# Patient Record
Sex: Male | Born: 1975 | Race: White | Hispanic: No | Marital: Single | Smoking: Current every day smoker
Health system: Southern US, Community
[De-identification: ages and names within clinical notes are randomized; demographics above are authoritative.]

## PROBLEM LIST (undated history)

## (undated) DIAGNOSIS — E7800 Pure hypercholesterolemia, unspecified: Secondary | ICD-10-CM

## (undated) DIAGNOSIS — I10 Essential (primary) hypertension: Secondary | ICD-10-CM

## (undated) HISTORY — DX: Essential (primary) hypertension: I10

## (undated) HISTORY — DX: Pure hypercholesterolemia, unspecified: E78.00

---

## 2002-04-15 ENCOUNTER — Emergency Department (HOSPITAL_COMMUNITY): Admission: EM | Admit: 2002-04-15 | Discharge: 2002-04-15 | Payer: Self-pay | Admitting: Emergency Medicine

## 2017-10-12 ENCOUNTER — Ambulatory Visit: Payer: 59 | Admitting: Physician Assistant

## 2017-10-12 ENCOUNTER — Encounter: Payer: Self-pay | Admitting: Physician Assistant

## 2017-10-12 ENCOUNTER — Other Ambulatory Visit: Payer: Self-pay

## 2017-10-12 VITALS — BP 144/78 | HR 71 | Temp 98.2°F | Ht 66.0 in | Wt 158.4 lb

## 2017-10-12 DIAGNOSIS — L03113 Cellulitis of right upper limb: Secondary | ICD-10-CM | POA: Diagnosis not present

## 2017-10-12 MED ORDER — CHLORHEXIDINE GLUCONATE 2 % EX SOLN: 1.0000 "application " | Freq: Every day | CUTANEOUS | 1 refills | Status: AC

## 2017-10-12 MED ORDER — SULFAMETHOXAZOLE-TRIMETHOPRIM 800-160 MG PO TABS: 1.0000 | ORAL_TABLET | Freq: Two times a day (BID) | ORAL | 0 refills | Status: AC

## 2017-10-12 NOTE — Progress Notes (Signed)
PRIMARY CARE AT [facility] [address], [place] [address] [phone]  Date:  10/12/2017   Name:  Dylan Lam   DOB:  July 31, 1975   MRN:  [id number]  PCP:  Patient, No Pcp Per    History of Present Illness:  Dylan Lam is a 42 y.o. male patient who presents to PCP with  Chief Complaint  Patient presents with  . right arm pain    there is a bumb on pt right arm in the inner elbow area. There are at least 2 more bump on body. Open soar, pus and  red ring and. Pt has a before, during and after on body.      Rash of swollen nodules that get bumped and open up to pus lesions.  They are mildly painful.  Occurs at the chest and extremities.  No fever.  He went to an urgent care where he was given doxycycline for about 10 days.  Completed treatment.  This improved, but then came back when he completed antibiotic course more than 1 week ago.   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  He works out at home.    There are no active problems to display for this patient.   No past medical history on file.   Social History   Tobacco Use  . Smoking status: Current Every Day Smoker    Types: Cigarettes  . Smokeless tobacco: Never Used  . Tobacco comment: every other day   Substance Use Topics  . Alcohol use: No    Frequency: Never  . Drug use: No    No family history on file.  Allergies  Allergen Reactions  . Penicillins Rash    Was told this was an allergy as baby    Medication list has been reviewed and updated.  Current Outpatient Medications on File Prior to Visit  Medication Sig Dispense Refill  . acetaminophen (TYLENOL) 325 MG tablet Take 650 mg by mouth every 6 (six) hours as needed.    . loratadine (CLARITIN) 10 MG tablet Take 10 mg by mouth daily as needed for allergies.     No current facility-administered medications on file prior to visit.     ROS ROS otherwise unremarkable unless listed above.  Physical Examination: BP (!) 144/78 (BP Location:  Left Arm, Patient Position: Sitting, Cuff Size: Normal)   Pulse 71   Temp 98.2 F (36.8 C) (Oral)   Ht 5\' 6"  (1.676 m)   Wt 158 lb 6.4 oz (71.8 kg)   SpO2 99%   BMI 25.57 kg/m  Ideal Body Weight: Weight in (lb) to have BMI = 25: 154.6  Physical Exam  Constitutional: He is oriented to person, place, and time. He appears well-developed and well-nourished. No distress.  HENT:  Head: Normocephalic and atraumatic.  Eyes: Conjunctivae and EOM are normal. Pupils are equal, round, and reactive to light.  Cardiovascular: Normal rate.  Pulmonary/Chest: Effort normal. No respiratory distress.  Neurological: He is alert and oriented to person, place, and time.  Skin: Skin is warm and dry. He is not diaphoretic.  Right anterior medial elbow 1cm with erythematous nodule with central purulent discharge. Right side of chest with similar smaller lesion about .5 cm Abdomen with erythematous nodule  Psychiatric: He has a normal mood and affect. His behavior is normal.     Assessment and Plan: [name] is a 42 y.o. male who is here today for cc of  Chief Complaint  Patient presents  with  . right arm pain    there is a bumb on pt right arm in the inner elbow area. There are at least 2 more bump on body. Open soar, pus and  red ring and. Pt has a before, during and after on body.   started bactrim wound culture obtained.  Given chlorhexidine daily for 5 days.  Discussed wound care.  Follow up in 1 week.  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  Cellulitis of right upper extremity - Plan: sulfamethoxazole-trimethoprim (BACTRIM DS,SEPTRA DS) 800-160 MG tablet, Chlorhexidine Gluconate 2 % SOLN, WOUND CULTURE, CANCELED: WOUND CULTURE  Trena PlattStephanie English, PA-C [facility] 3/19/20194:20 PM

## 2017-10-12 NOTE — Patient Instructions (Addendum)
Please wash with the chlorhexidine.  Use a cloth and change it daily with each wash.  Wash the area wound as well, at your shoulder. I will follow up with you regarding the lab results. Please use a new cloth each time you bathe Please disinfect the materials you come into contact with.   Keep this area bandaged until it scabs over.  You will wash it twice per day with soap and water.  Avoid deodorant soaps.  You may use antibacterial soap or gold dial.      Cellulitis, Adult Cellulitis is a skin infection. The infected area is usually red and sore. This condition occurs most often in the arms and lower legs. It is very important to get treated for this condition. Follow these instructions at home:  Take over-the-counter and prescription medicines only as told by your doctor.  If you were prescribed an antibiotic medicine, take it as told by your doctor. Do not stop taking the antibiotic even if you start to feel better.  Drink enough fluid to keep your pee (urine) clear or pale yellow.  Do not touch or rub the infected area.  Raise (elevate) the infected area above the level of your heart while you are sitting or lying down.  Place warm or cold wet cloths (warm or cold compresses) on the infected area. Do this as told by your doctor.  Keep all follow-up visits as told by your doctor. This is important. These visits let your doctor make sure your infection is not getting worse. Contact a doctor if:  You have a fever.  Your symptoms do not get better after 1-2 days of treatment.  Your bone or joint under the infected area starts to hurt after the skin has healed.  Your infection comes back. This can happen in the same area or another area.  You have a swollen bump in the infected area.  You have new symptoms.  You feel ill and also have muscle aches and pains. Get help right away if:  Your symptoms get worse.  You feel very sleepy.  You throw up (vomit) or have watery poop  (diarrhea) for a long time.  There are red streaks coming from the infected area.  Your red area gets larger.  Your red area turns darker. This information is not intended to replace advice given to you by your health care provider. Make sure you discuss any questions you have with your health care provider. Document Released: 12/31/2007 Document Revised: 12/20/2015 Document Reviewed: 05/23/2015 Elsevier Interactive Patient Education  2018 [employer]   IF you received an x-ray today, you will receive an invoice from [facility]. Please contact [facility] at [phone] with questions or concerns regarding your invoice.   IF you received labwork today, you will receive an invoice from [facility]. Please contact [facility] at [phone] with questions or concerns regarding your invoice.   Our billing staff will not be able to assist you with questions regarding bills from these companies.  You will be contacted with the lab results as soon as they are available. The fastest way to get your results is to activate your My Chart account. Instructions are located on the last page of this paperwork. If you have not heard from us regarding the results in 2 weeks, please contact this office.

## 2017-10-14 LAB — WOUND CULTURE

## 2017-10-19 ENCOUNTER — Other Ambulatory Visit: Payer: Self-pay

## 2017-10-19 ENCOUNTER — Encounter: Payer: Self-pay | Admitting: Physician Assistant

## 2017-10-19 ENCOUNTER — Ambulatory Visit (INDEPENDENT_AMBULATORY_CARE_PROVIDER_SITE_OTHER): Payer: 59 | Admitting: Physician Assistant

## 2017-10-19 VITALS — BP 130/84 | HR 74 | Temp 98.7°F | Resp 18 | Ht 66.0 in | Wt 159.8 lb

## 2017-10-19 DIAGNOSIS — A4901 Methicillin susceptible Staphylococcus aureus infection, unspecified site: Secondary | ICD-10-CM

## 2017-10-19 MED ORDER — MUPIROCIN 2 % EX OINT: 1.0000 "application " | TOPICAL_OINTMENT | Freq: Two times a day (BID) | CUTANEOUS | 1 refills | Status: AC

## 2017-10-19 NOTE — Progress Notes (Signed)
PRIMARY CARE AT [facility] [address], [place] [address] [phone]  Date:  10/19/2017   Name:  Dylan Lam   DOB:  02-23-1976   MRN:  [id number]  PCP:  Patient, No Pcp Per    History of Present Illness:  KARMELO Lam is a 42 y.o. male patient who presents to PCP with  Chief Complaint  Patient presents with  . 7 day follow up for cellulitis right upper extremity     Patient follows up with rash.  Has scabbed erythematous lesions.  Wound culture was placed and resulted as staph aureus.  This was susceptible to the bactrim.  He reports that the wound is healing.  He is taking the bactrim compliantly.  He notes no fever or other lesion outbreak.    There are no active problems to display for this patient.   History reviewed. No pertinent past medical history.  History reviewed. No pertinent surgical history.  Social History   Tobacco Use  . Smoking status: Current Every Day Smoker    Types: Cigarettes  . Smokeless tobacco: Never Used  . Tobacco comment: every other day   Substance Use Topics  . Alcohol use: No    Frequency: Never  . Drug use: No    History reviewed. No pertinent family history.  Allergies  Allergen Reactions  . Penicillins Rash    Was told this was an allergy as baby    Medication list has been reviewed and updated.  Current Outpatient Medications on File Prior to Visit  Medication Sig Dispense Refill  . acetaminophen (TYLENOL) 325 MG tablet Take 650 mg by mouth every 6 (six) hours as needed.    . loratadine (CLARITIN) 10 MG tablet Take 10 mg by mouth daily as needed for allergies.    Marland Kitchen sulfamethoxazole-trimethoprim (BACTRIM DS,SEPTRA DS) 800-160 MG tablet Take 1 tablet by mouth 2 (two) times daily. 20 tablet 0  . Chlorhexidine Gluconate 2 % SOLN Apply 1 application topically daily for 7 days. (Patient not taking: Reported on 10/19/2017) 60 mL 1   No current facility-administered medications on file prior to visit.     ROS ROS  otherwise unremarkable unless listed above.  Physical Examination: BP (!) 158/89 (BP Location: Right Arm, Patient Position: Sitting, Cuff Size: Normal)   Pulse 74   Temp 98.7 F (37.1 C) (Oral)   Resp 18   Ht 5\' 6"  (1.676 m)   Wt 159 lb 12.8 oz (72.5 kg)   SpO2 98%   BMI 25.79 kg/m  Ideal Body Weight: Weight in (lb) to have BMI = 25: 154.6  Physical Exam  Constitutional: He is oriented to person, place, and time. He appears well-developed and well-nourished. No distress.  HENT:  Head: Normocephalic and atraumatic.  Eyes: Pupils are equal, round, and reactive to light. Conjunctivae and EOM are normal.  Cardiovascular: Normal rate.  Pulmonary/Chest: Effort normal. No respiratory distress.  Neurological: He is alert and oriented to person, place, and time.  Skin: Skin is warm and dry. He is not diaphoretic.  Right skin anterior elbow with healing lesion without purulent fluid.  Mildly erythematous papule at the abdomen and fading lesion at the right upper anterior chest.   Psychiatric: He has a normal mood and affect. His behavior is normal.     Assessment and Plan: Dylan Lam is a 42 y.o. male who is here today for cc of  Chief Complaint  Patient presents with  . 7 day follow up for  cellulitis right upper extremity  given mupirocin for nares.  Advised treatment plan.   Follow up as needed.  Staph aureus infection - Plan: mupirocin ointment (BACTROBAN) 2 %  Trena PlattStephanie Andreu Drudge, PA-C [facility] 3/26/20197:14 PM

## 2017-10-19 NOTE — Patient Instructions (Addendum)
Please continue the antibiotic as prescribed.   Please apply this to the nares as prescribed.  You can do this for 5 days.       IF you received an x-ray today, you will receive an invoice from [facility]. Please contact [facility] at [phone] with questions or concerns regarding your invoice.   IF you received labwork today, you will receive an invoice from [facility]. Please contact [facility] at [phone] with questions or concerns regarding your invoice.   Our billing staff will not be able to assist you with questions regarding bills from these companies.  You will be contacted with the lab results as soon as they are available. The fastest way to get your results is to activate your My Chart account. Instructions are located on the last page of this paperwork. If you have not heard from us regarding the results in 2 weeks, please contact this office.

## 2017-10-20 ENCOUNTER — Encounter: Payer: Self-pay | Admitting: Physician Assistant

## 2017-11-04 ENCOUNTER — Encounter: Payer: Self-pay | Admitting: Physician Assistant

## 2019-11-15 ENCOUNTER — Emergency Department (HOSPITAL_COMMUNITY): Payer: 59

## 2019-11-15 ENCOUNTER — Encounter (HOSPITAL_COMMUNITY): Payer: Self-pay

## 2019-11-15 ENCOUNTER — Other Ambulatory Visit: Payer: Self-pay

## 2019-11-15 ENCOUNTER — Emergency Department (HOSPITAL_COMMUNITY): Admission: EM | Admit: 2019-11-15 | Discharge: 2019-11-15 | Discharge status: Against Medical Advice | Payer: 59

## 2019-11-15 ENCOUNTER — Emergency Department (HOSPITAL_COMMUNITY)
Admission: EM | Admit: 2019-11-15 | Discharge: 2019-11-16 | Disposition: A | Discharge status: Home / Self Care | Payer: 59 | Attending: Emergency Medicine | Admitting: Emergency Medicine

## 2019-11-15 DIAGNOSIS — R55 Syncope and collapse: Secondary | ICD-10-CM | POA: Insufficient documentation

## 2019-11-15 DIAGNOSIS — R42 Dizziness and giddiness: Secondary | ICD-10-CM | POA: Insufficient documentation

## 2019-11-15 DIAGNOSIS — F17210 Nicotine dependence, cigarettes, uncomplicated: Secondary | ICD-10-CM | POA: Insufficient documentation

## 2019-11-15 DIAGNOSIS — I10 Essential (primary) hypertension: Secondary | ICD-10-CM | POA: Insufficient documentation

## 2019-11-15 DIAGNOSIS — R5383 Other fatigue: Secondary | ICD-10-CM | POA: Insufficient documentation

## 2019-11-15 LAB — BASIC METABOLIC PANEL
Anion gap: 12 (ref 5–15)
BUN: 13 mg/dL (ref 6–20)
CO2: 25 mmol/L (ref 22–32)
Calcium: 9 mg/dL (ref 8.9–10.3)
Chloride: 103 mmol/L (ref 98–111)
Creatinine, Ser: 1.03 mg/dL (ref 0.61–1.24)
GFR calc Af Amer: >60 mL/min (ref >60)
GFR calc non Af Amer: >60 mL/min (ref >60)
Glucose, Bld: 101 mg/dL — ABNORMAL HIGH (ref 70–99)
Potassium: 3.7 mmol/L (ref 3.5–5.1)
Sodium: 140 mmol/L (ref 135–145)

## 2019-11-15 LAB — URINALYSIS, ROUTINE W REFLEX MICROSCOPIC
Bilirubin Urine: NEGATIVE
Glucose, UA: NEGATIVE mg/dL
Hgb urine dipstick: NEGATIVE
Ketones, ur: NEGATIVE mg/dL
Leukocytes,Ua: NEGATIVE
Nitrite: NEGATIVE
Protein, ur: NEGATIVE mg/dL
Specific Gravity, Urine: 1.012 (ref 1.005–1.030)
pH: 6 (ref 5.0–8.0)

## 2019-11-15 LAB — GLUCOSE, CAPILLARY: Glucose-Capillary: 102 mg/dL — ABNORMAL HIGH (ref 70–99)

## 2019-11-15 LAB — CBC
HCT: 48.7 % (ref 39.0–52.0)
Hemoglobin: 16.2 g/dL (ref 13.0–17.0)
MCH: 28.8 pg (ref 26.0–34.0)
MCHC: 33.3 g/dL (ref 30.0–36.0)
MCV: 86.7 fL (ref 80.0–100.0)
Platelets: 351 10*3/uL (ref 150–400)
RBC: 5.62 MIL/uL (ref 4.22–5.81)
RDW: 13.9 % (ref 11.5–15.5)
WBC: 10.4 10*3/uL (ref 4.0–10.5)
nRBC: 0 % (ref 0.0–0.2)

## 2019-11-15 LAB — TROPONIN I (HIGH SENSITIVITY): Troponin I (High Sensitivity): 3 ng/L (ref <18)

## 2019-11-15 MED ORDER — SODIUM CHLORIDE 0.9% FLUSH: 3.0000 mL | Freq: Once | INTRAVENOUS | Status: DC

## 2019-11-15 NOTE — ED Provider Notes (Signed)
[facility]-EMERGENCY DEPT Provider Note   CSN: [id number] Arrival date & time: 11/15/19  2109     History Chief Complaint  Patient presents with  . Hypertension  . Dizziness    Dylan Lam is a 44 y.o. male who presents for evaluation of hypertension, dizziness/lightheaded.  Patient reports that about days ago, he was working outside and states that it was hot.  He states that he felt a little funny and described as a lightheaded sensation.  He states that it quickly resolved and he was fine throughout the weekend and was able to continue his normal activities.  He states yesterday while at work, he had a similar episode and just felt more tired and fatigued.  He reports that today, he felt dizzy, lightheaded.  He denies room spinning sensation does not feel wobbly or drunk when he walks.  He states that he feels "like he is afraid that he might pass out and just not himself."  He states that he just feels fatigued and feels like he is out of energy.  He does state that the dizziness/lightheadedness sensation is worse when he bends down.  He was told at work that it might be because of his blood pressure.  He feels like since 2 PM this evening, the symptoms have been persistent.  He does not have any history of hypertension.  He states he has some "Chest congestion" that he attributes to allergies.  He states that he has had this feeling before and denies any heaviness or sharp pain.  He does smoke 12 cigarettes a day.  Denies any other drug use or alcohol use.  He denies any vision changes, difficulty breathing, abdominal pain, nausea/vomiting, numbness/weakness of his arms or legs.  He does not have any history of diabetes or hypertension.  No personal cardiac history.  No family history of MI before the age of 37.  The history is provided by the patient.       History reviewed. No pertinent past medical history.  There are no problems to display for this  patient.   History reviewed. No pertinent surgical history.     No family history on file.  Social History   Tobacco Use  . Smoking status: Current Every Day Smoker    Types: Cigarettes  . Smokeless tobacco: Never Used  . Tobacco comment: every other day   Substance Use Topics  . Alcohol use: No  . Drug use: No    Home Medications Prior to Admission medications   Medication Sig Start Date End Date Taking? Authorizing Provider  amLODipine (NORVASC) 5 MG tablet Take 1 tablet (5 mg total) by mouth daily. 11/16/19   Maxwell Caul, PA-C  mupirocin ointment (BACTROBAN) 2 % Apply 1 application topically 2 (two) times daily. Patient not taking: Reported on 11/15/2019 10/19/17   Trena Platt D, PA  sulfamethoxazole-trimethoprim (BACTRIM DS,SEPTRA DS) 800-160 MG tablet Take 1 tablet by mouth 2 (two) times daily. Patient not taking: Reported on 11/15/2019 10/12/17   Trena Platt D, PA    Allergies    Penicillins  Review of Systems   Review of Systems  Constitutional: Negative for fever.  Eyes: Negative for visual disturbance.  Respiratory: Negative for cough and shortness of breath.   Cardiovascular: Negative for chest pain.  Gastrointestinal: Negative for abdominal pain, nausea and vomiting.  Genitourinary: Negative for dysuria and hematuria.  Neurological: Positive for dizziness and light-headedness. Negative for weakness, numbness and headaches.  All other  systems reviewed and are negative.   Physical Exam Updated Vital Signs BP (!) 151/91   Pulse (!) 56   Temp 98.4 F (36.9 C) (Oral)   Resp 14   Ht 5\' 6"  (1.676 m)   Wt 82.1 kg   SpO2 97%   BMI 29.21 kg/m   Physical Exam Vitals and nursing note reviewed.  Constitutional:      Appearance: Normal appearance. He is well-developed.  HENT:     Head: Normocephalic and atraumatic.  Eyes:     General: Lids are normal.     Conjunctiva/sclera: Conjunctivae normal.     Pupils: Pupils are equal, round, and  reactive to light.     Comments: PERRL. EOMs intact. No nystagmus. No neglect.   Cardiovascular:     Rate and Rhythm: Normal rate and regular rhythm.     Pulses: Normal pulses.          Radial pulses are 2+ on the right side and 2+ on the left side.     Heart sounds: Normal heart sounds. No murmur. No friction rub. No gallop.   Pulmonary:     Effort: Pulmonary effort is normal.     Breath sounds: Normal breath sounds.     Comments: Lungs clear to auscultation bilaterally.  Symmetric chest rise.  No wheezing, rales, rhonchi. Abdominal:     Palpations: Abdomen is soft. Abdomen is not rigid.     Tenderness: There is no abdominal tenderness. There is no guarding.  Musculoskeletal:        General: Normal range of motion.     Cervical back: Full passive range of motion without pain.  Skin:    General: Skin is warm and dry.     Capillary Refill: Capillary refill takes less than 2 seconds.  Neurological:     Mental Status: He is alert and oriented to person, place, and time.     Comments: Cranial nerves III-XII intact Follows commands, Moves all extremities  5/5 strength to BUE and BLE  Sensation intact throughout all major nerve distributions No gait abnormalities  No slurred speech. No facial droop.  Negative Rhomberg.   Psychiatric:        Speech: Speech normal.     ED Results / Procedures / Treatments   Labs (all labs ordered are listed, but only abnormal results are displayed) Labs Reviewed  BASIC METABOLIC PANEL - Abnormal; Notable for the following components:      Result Value   Glucose, Bld 101 (*)    All other components within normal limits  GLUCOSE, CAPILLARY - Abnormal; Notable for the following components:   Glucose-Capillary 102 (*)    All other components within normal limits  CBC  URINALYSIS, ROUTINE W REFLEX MICROSCOPIC  CBG MONITORING, ED  TROPONIN I (HIGH SENSITIVITY)  TROPONIN I (HIGH SENSITIVITY)    EKG EKG Interpretation  Date/Time:  Tuesday November 15 2019 21:25:20 EDT Ventricular Rate:  60 PR Interval:    QRS Duration: 102 QT Interval:  412 QTC Calculation: 412 R Axis:   69 Text Interpretation: Sinus rhythm ST elev, probable normal early repol pattern 12 Lead; Mason-Likar Confirmed by [id number] (Ross Marcus) on 11/16/2019 12:18:13 AM   Radiology DG Chest 2 View  Result Date: 11/15/2019 CLINICAL DATA:  44 year old male with dizziness and chest congestion. EXAM: CHEST - 2 VIEW COMPARISON:  Chest radiograph dated 01/01/2011. FINDINGS: Mild diffuse chronic interstitial coarsening and bronchitic changes. No focal consolidation, pleural effusion, pneumothorax. The cardiac silhouette is within  normal limits. No acute osseous pathology. IMPRESSION: No active cardiopulmonary disease. Electronically Signed   By: Elgie Collard M.D.   On: 11/15/2019 21:41   CT Head Wo Contrast  Result Date: 11/16/2019 CLINICAL DATA:  Dizziness, fall. EXAM: CT HEAD WITHOUT CONTRAST TECHNIQUE: Contiguous axial images were obtained from the base of the skull through the vertex without intravenous contrast. COMPARISON:  None. FINDINGS: Brain: No acute intracranial abnormality. Specifically, no hemorrhage, hydrocephalus, mass lesion, acute infarction, or significant intracranial injury. Vascular: No hyperdense vessel or unexpected calcification. Skull: No acute calvarial abnormality. Sinuses/Orbits: Visualized paranasal sinuses and mastoids clear. Orbital soft tissues unremarkable. Other: None IMPRESSION: Normal study. Electronically Signed   By: Charlett Nose M.D.   On: 11/16/2019 01:09    Procedures Procedures (including critical care time)  Medications Ordered in ED Medications  sodium chloride flush (NS) 0.9 % injection 3 mL (has no administration in time range)  amLODipine (NORVASC) tablet 5 mg (5 mg Oral Given 11/16/19 0206)    ED Course  I have reviewed the triage vital signs and the nursing notes.  Pertinent labs & imaging results that were available  during my care of the patient were reviewed by me and considered in my medical decision making (see chart for details).    MDM Rules/Calculators/A&P                      44 year old male who presents for evaluation of concerns of hypertension, lightheadedness.  No history of hypertension but noted his blood pressure to be slightly elevated at work when he had symptoms.  No fevers, vision changes, numbness/weakness of arms or legs.  On initially arrival, he is afebrile nontoxic-appearing.  Vital signs are stable.  No neuro deficits noted on exam.  Patient able to ambulate without any signs of gait ataxia.  His blood pressure is slightly elevated here.  He states that he has had episodes of high blood pressure before but no history of hypertension.  No true chest pain.  The triage note does mention he is having some chest pain he described to me as more of a chest congestion that he has had previously.  No shortness of breath.  Plan to check labs, EKG.  Additionally, given positional nature of his symptoms, will plan to check CT head for evaluation of intracranial mass.  History/physical exam is not concerning for CVA, posterior circulation stroke, carotid dissection, intracranial hemorrhage.  Additionally, do not suspect ACS etiology given history/physical exam.  Initial trop negative.  CBC unremarkable.  BUN without any acute abnormalities.  UA negative.  Chest x-ray negative for any acute infectious etiology.  CT head negative for any acute intracranial abnormality.  Delta troponin negative.  Patient still slightly elevated blood pressures.  I reviewed his records.  He did have an episode about 2 years ago when he was seen here in the ED where he was hypertensive.  His symptoms could be related to high blood pressure.  We will plan to start patient on a very low-dose of amlodipine for high blood pressure.  Patient instructed to follow-up with his primary care doctor. Discussed patient with Dr. Wilkie Aye  who is agreeable to plan. At this time, patient exhibits no emergent life-threatening condition that require further evaluation in ED. Patient had ample opportunity for questions and discussion. All patient's questions were answered with full understanding. Strict return precautions discussed. Patient expresses understanding and agreement to plan.   Portions of this note were generated with Dragon  dictation software. Dictation errors may occur despite best attempts at proofreading.   Final Clinical Impression(s) / ED Diagnoses Final diagnoses:  Hypertension, unspecified type  Lightheadedness    Rx / DC Orders ED Discharge Orders         Ordered    amLODipine (NORVASC) 5 MG tablet  Daily     11/16/19 0202           Volanda Napoleon, PA-C 11/16/19 0500    Dina Rich Barbette Hair, MD 11/16/19 [phone]

## 2019-11-15 NOTE — ED Notes (Signed)
Patient transported to X-ray 

## 2019-11-15 NOTE — ED Triage Notes (Signed)
Patient arrived stating that he felt dizzy today and checked his blood pressure at work which was elevated. States he is a smoker and "feels something in his chest but there is no pain."

## 2019-11-16 ENCOUNTER — Emergency Department (HOSPITAL_COMMUNITY): Payer: 59

## 2019-11-16 LAB — TROPONIN I (HIGH SENSITIVITY): Troponin I (High Sensitivity): 3 ng/L (ref <18)

## 2019-11-16 MED ORDER — AMLODIPINE BESYLATE 5 MG PO TABS
5.0000 mg | ORAL_TABLET | Freq: Once | ORAL | Status: AC
  Administered 2019-11-16: 5 mg via ORAL
  Filled 2019-11-16: qty 1

## 2019-11-16 MED ORDER — AMLODIPINE BESYLATE 5 MG PO TABS: 5.0000 mg | ORAL_TABLET | Freq: Every day | ORAL | 1 refills | Status: AC

## 2019-11-16 NOTE — Discharge Instructions (Signed)
As we discussed, your work-up here today was reassuring.  Your blood work, CT scan did not show any abnormalities.  Your blood pressure continued to remain elevated here.  This could be contributing to your symptoms.  We will plan to start you on a very low dose of blood pressure medication.  Additionally, it is important that you find a primary care doctor to follow-up with.  I have given you a [facility] referral.  You should follow-up with him to his range primary care.  Stop the amlodipine if you feel lightheaded or like you are going to pass out.  Return to the emergency department for any dizziness, vision changes, numbness/weakness of your arms or legs, vomiting, difficulty walking or any other worsening concerning symptoms.

## 2020-06-15 ENCOUNTER — Encounter: Payer: 59 | Admitting: Registered Nurse

## 2021-07-28 IMAGING — CR DG CHEST 2V
2 series · 2 of 2 positions shown · non-contrast
Comparison: Chest radiograph dated 01/01/2011.

CLINICAL DATA: 43-year-old male with dizziness and chest
congestion.

EXAM:
CHEST - 2 VIEW

[w chest pa]
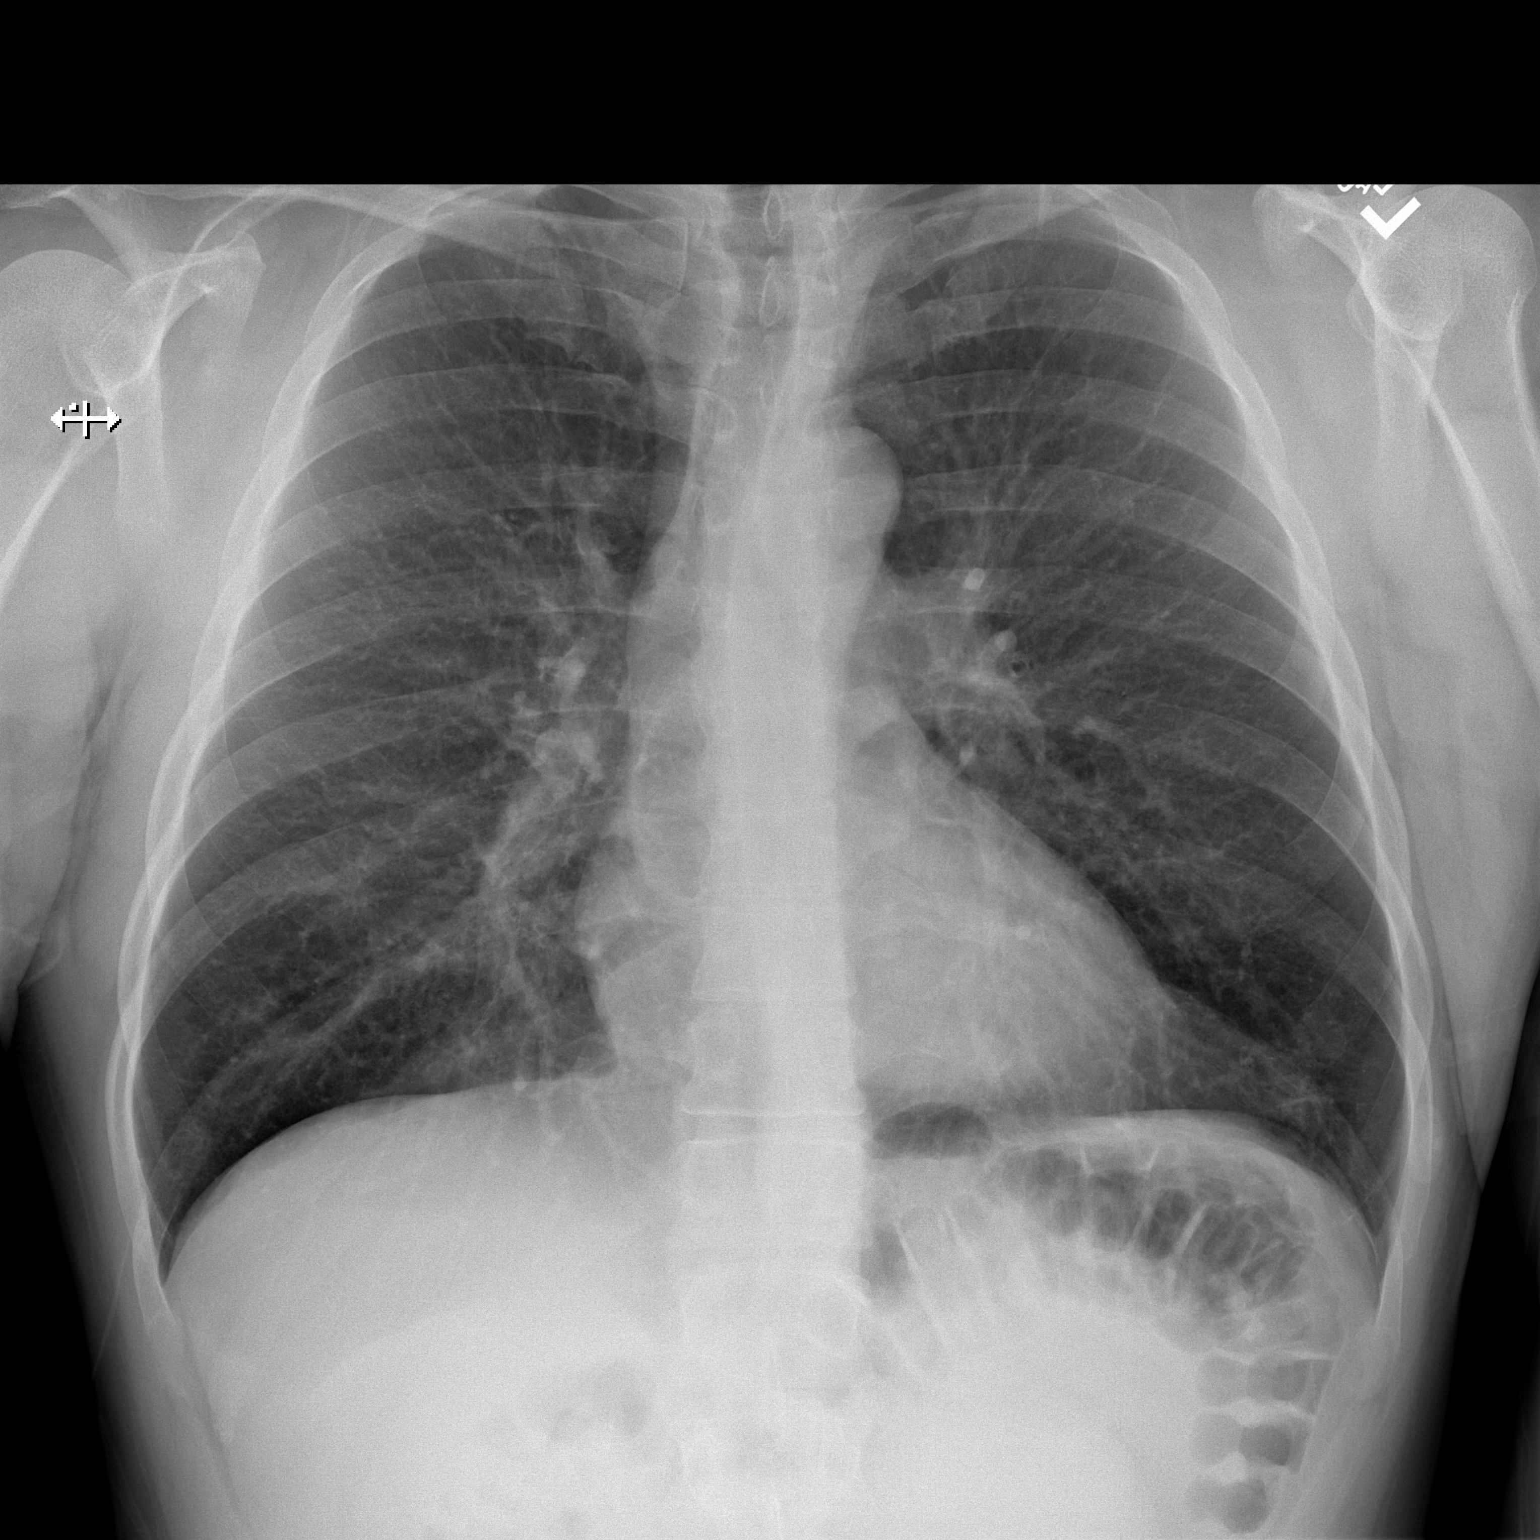

[w chest lat]
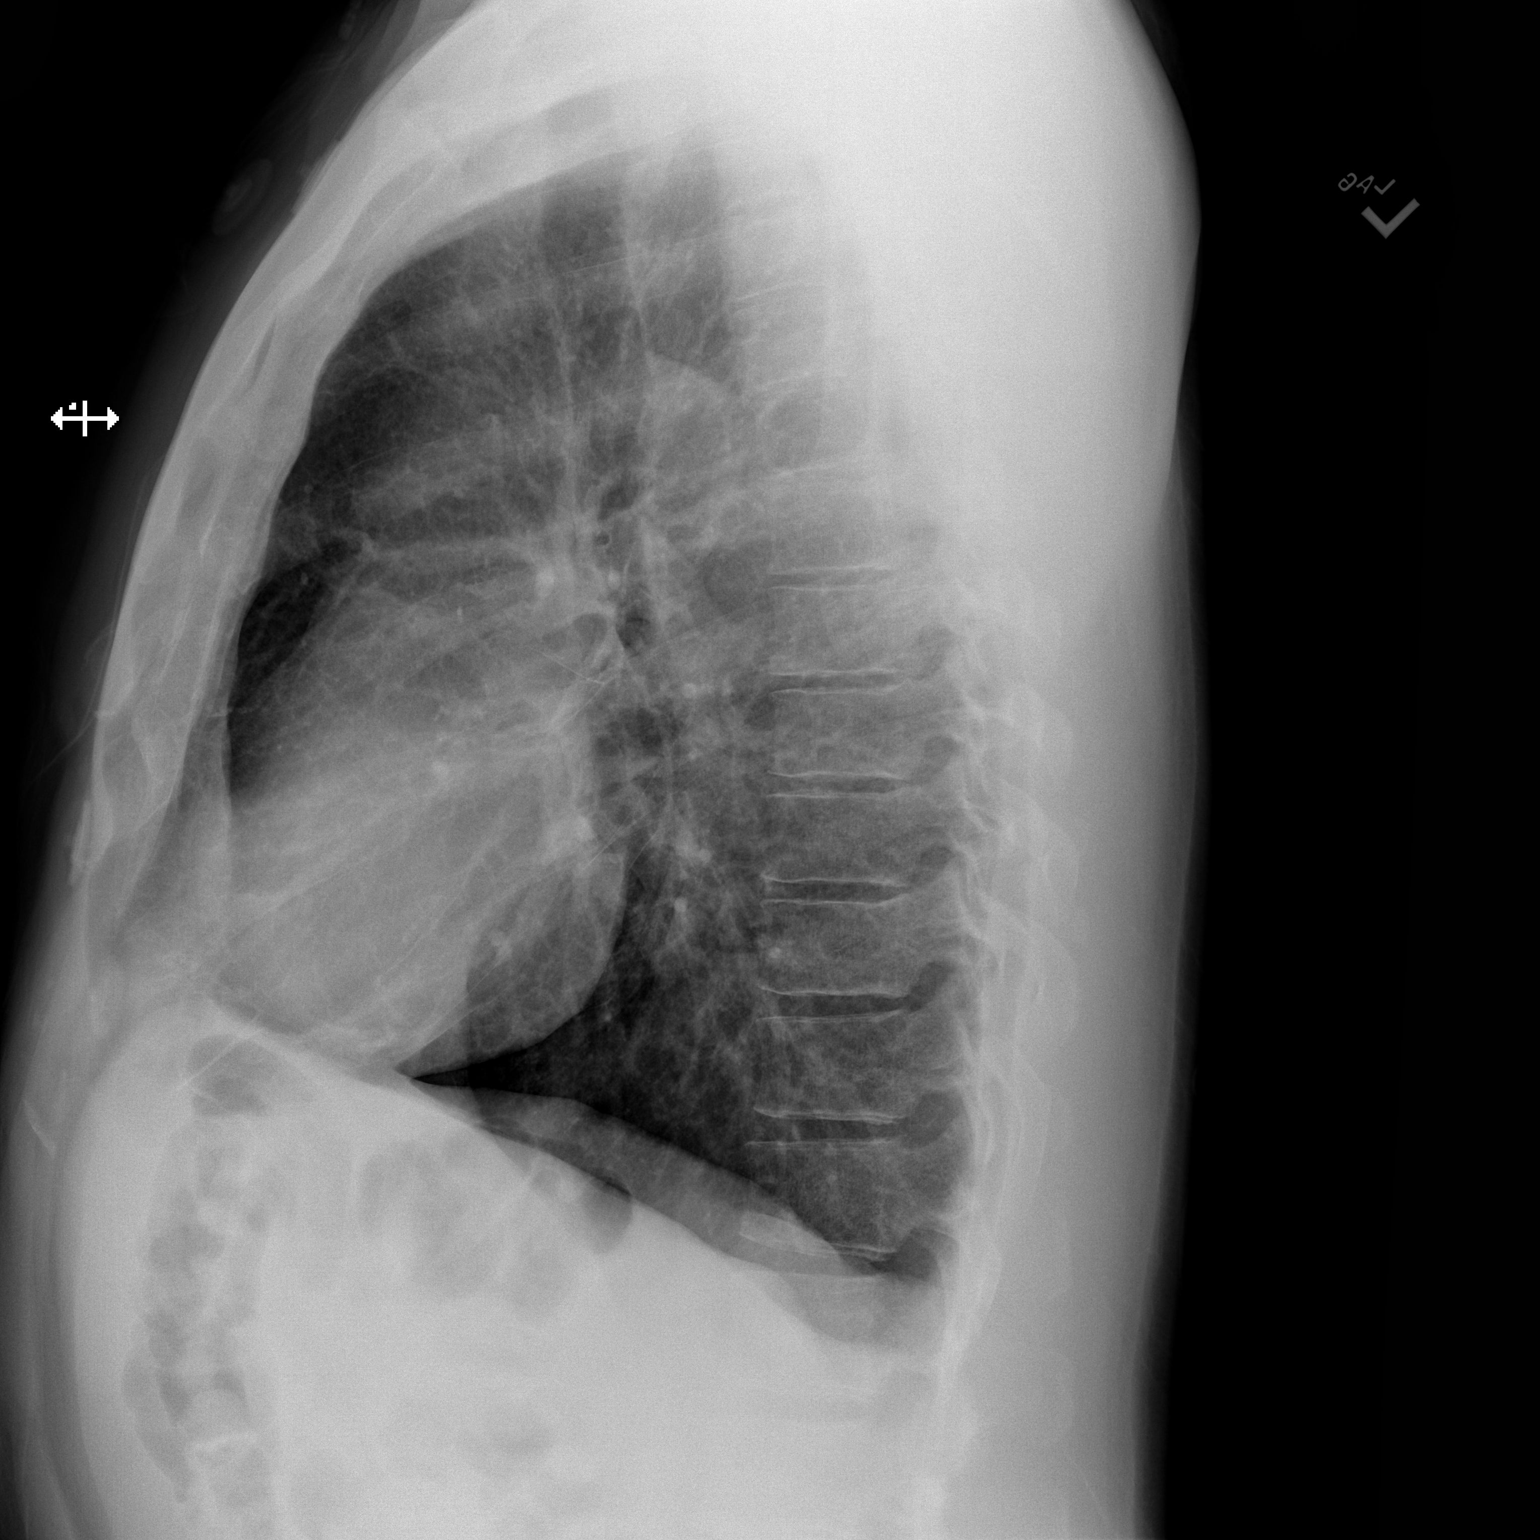

[2 of 2 positions shown; findings below may reference images not displayed]

FINDINGS: Mild diffuse chronic interstitial coarsening and bronchitic changes.
No focal consolidation, pleural effusion, pneumothorax. The cardiac
silhouette is within normal limits. No acute osseous pathology.
IMPRESSION: No active cardiopulmonary disease.

## 2021-07-29 IMAGING — CT CT HEAD W/O CM
3 series · 16 of 47 positions shown, 19 images · non-contrast
Comparison: None.

CLINICAL DATA: Dizziness, fall.

EXAM:
CT HEAD WITHOUT CONTRAST
TECHNIQUE: Contiguous axial images were obtained from the base of the skull
through the vertex without intravenous contrast.

[Series 3: head wo · axial · 0.47mm/px · z∈[-111,+19]mm · 10 of 32 slices shown, 13 images]
[im 3/32  brain]
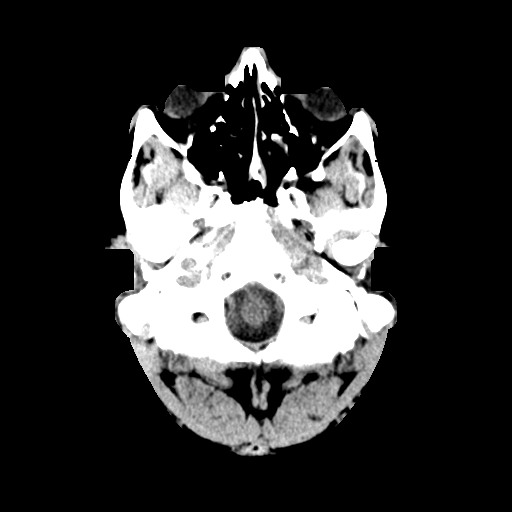
[im 3/32  bone]
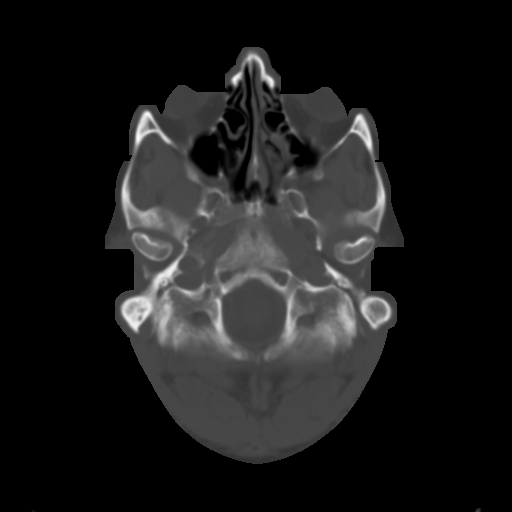
[im 6/32  brain]
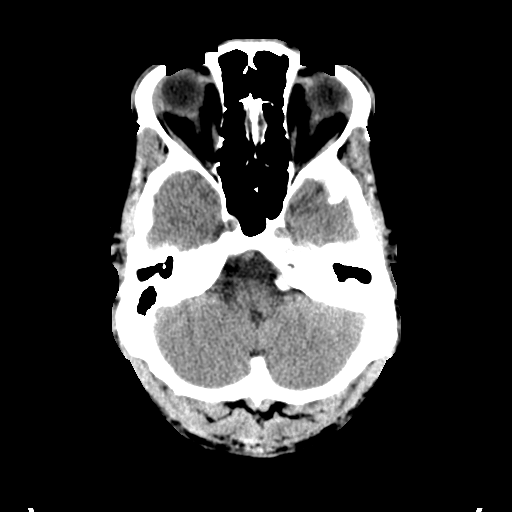
[im 9/32  brain]
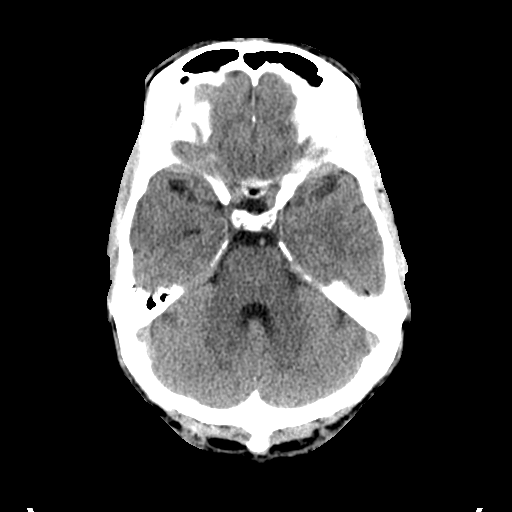
[im 11/32  brain]
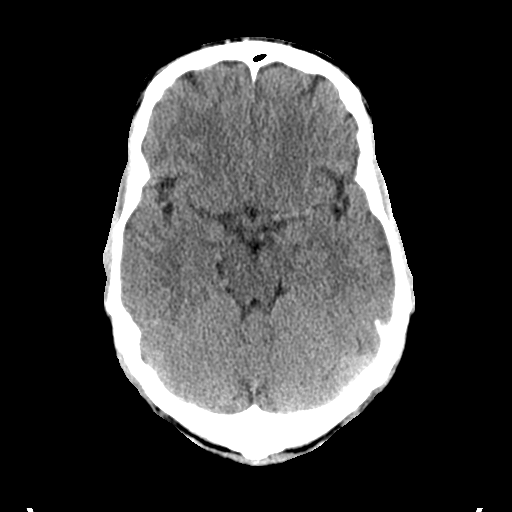
[im 14/32  brain]
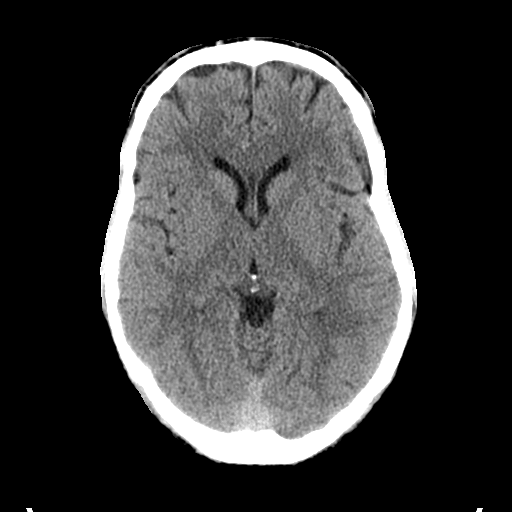
[im 14/32  bone]
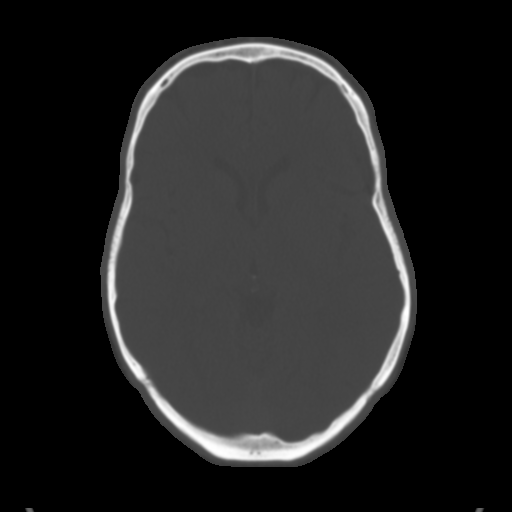
[im 18/32  brain]
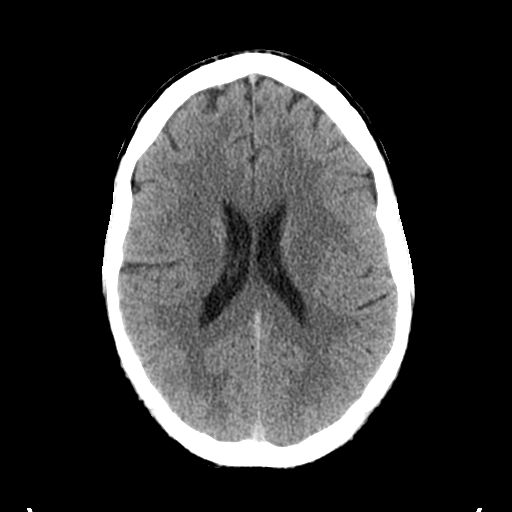
[im 21/32  brain]
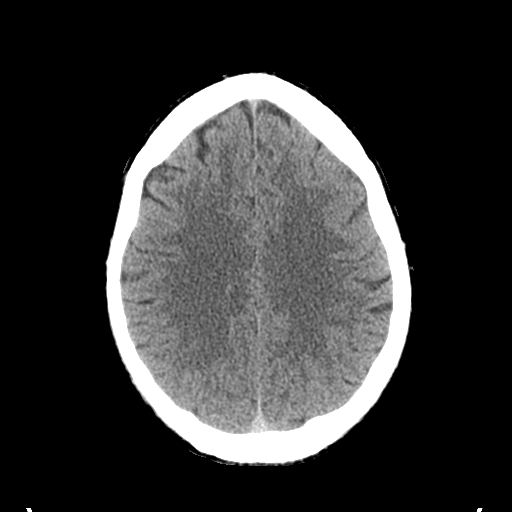
[im 24/32  brain]
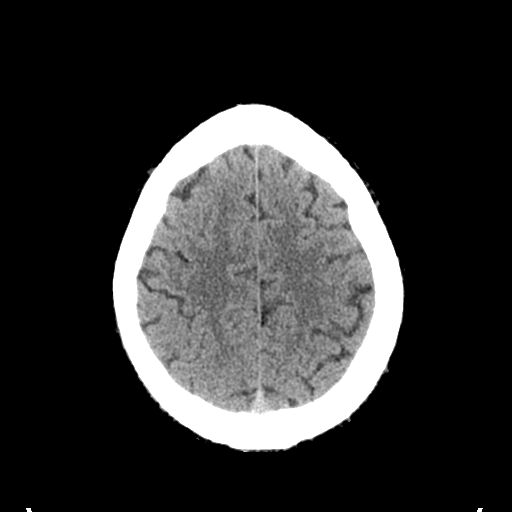
[im 26/32  brain]
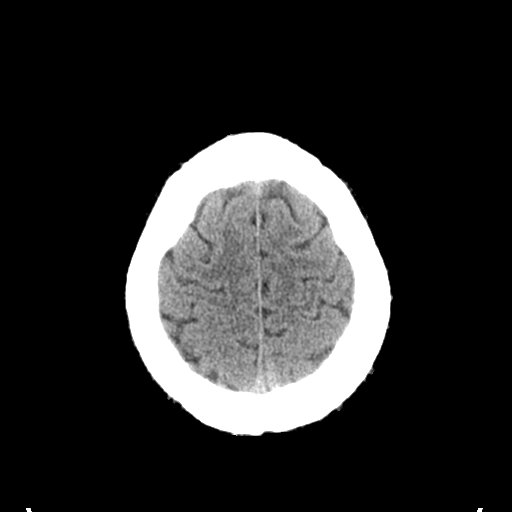
[im 26/32  bone]
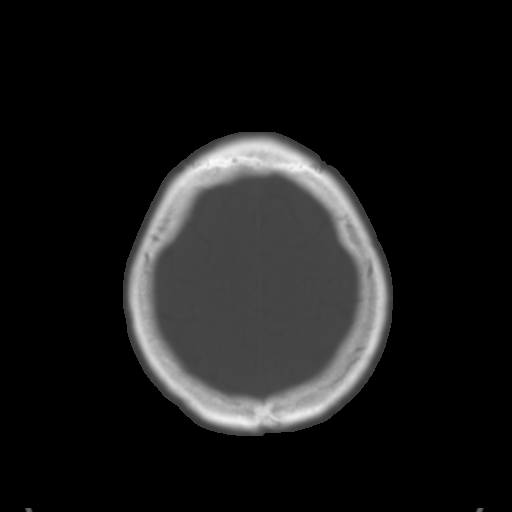
[im 29/32  brain]
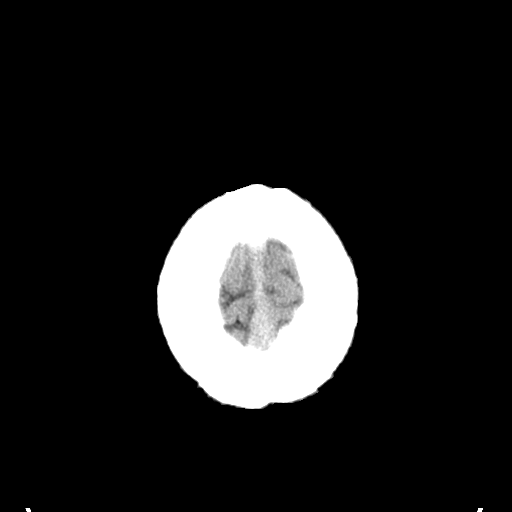

[Series 5: coronal soft tissue · coronal · 0.31mm/px · 3 of 68 slices shown]
[im 23/68  brain]
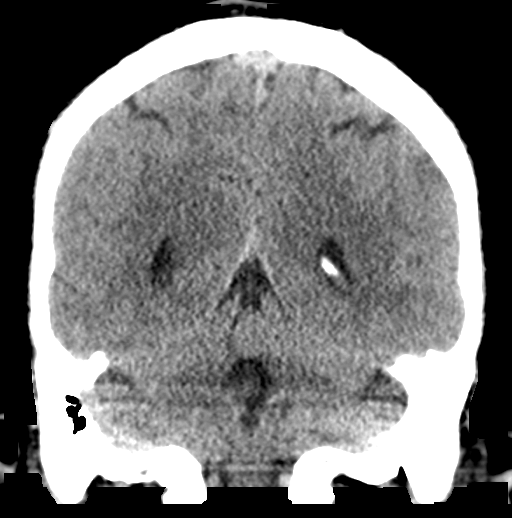
[im 30/68  brain]
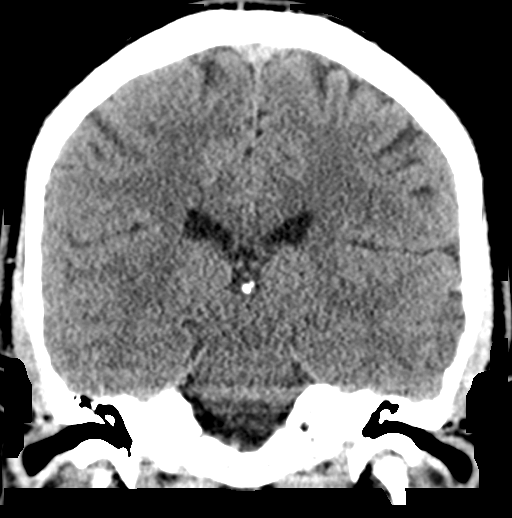
[im 38/68  brain]
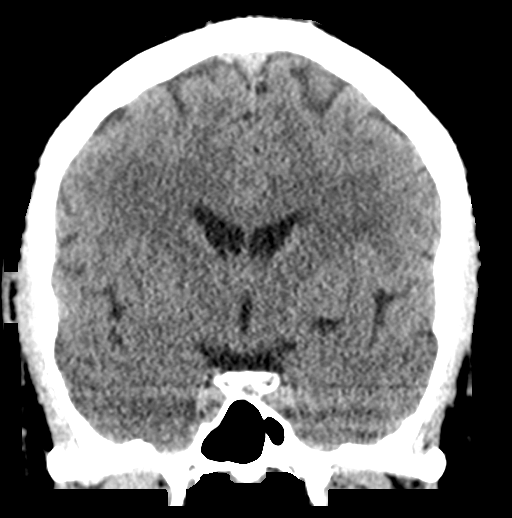

[Series 6: sagittal soft tissue · sagittal · 0.32mm/px · 3 of 52 slices shown]
[im 18/52  brain]
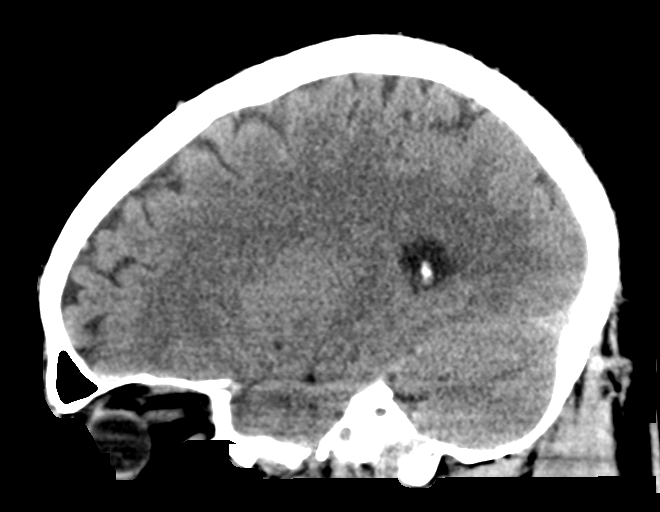
[im 26/52  brain]
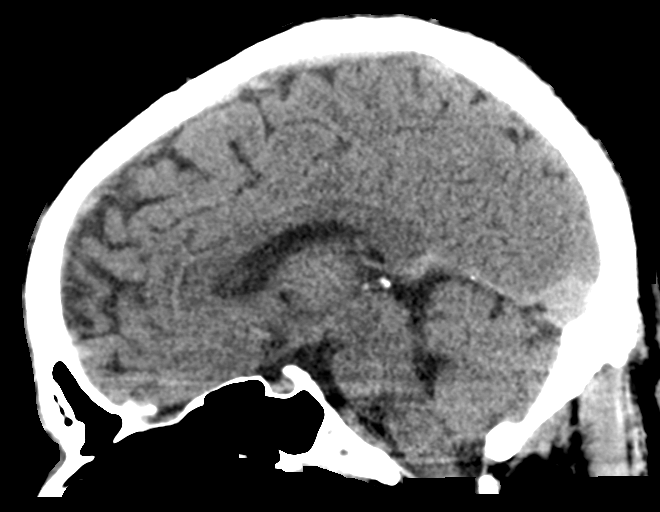
[im 35/52  brain]
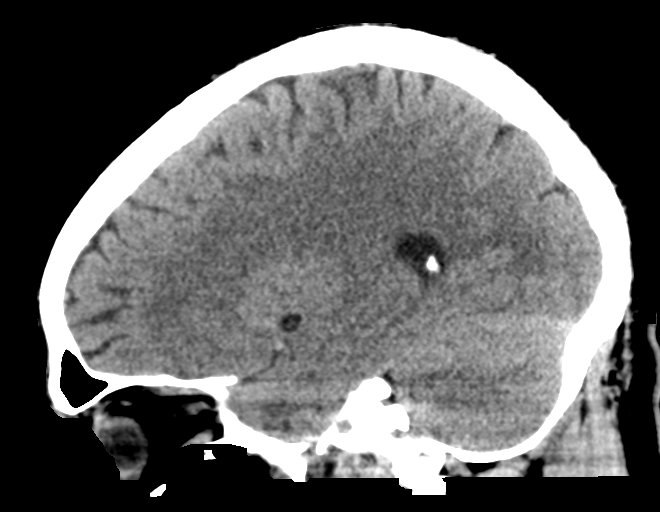

[16 of 47 positions shown; findings below may reference images not displayed]

FINDINGS: Brain: No acute intracranial abnormality. Specifically, no
hemorrhage, hydrocephalus, mass lesion, acute infarction, or
significant intracranial injury.

Vascular: No hyperdense vessel or unexpected calcification.

Skull: No acute calvarial abnormality.

Sinuses/Orbits: Visualized paranasal sinuses and mastoids clear.
Orbital soft tissues unremarkable.

Other: None
IMPRESSION: Normal study.

## 2023-02-07 ENCOUNTER — Other Ambulatory Visit: Payer: Self-pay

## 2023-02-07 ENCOUNTER — Emergency Department (HOSPITAL_BASED_OUTPATIENT_CLINIC_OR_DEPARTMENT_OTHER)
Admission: EM | Admit: 2023-02-07 | Discharge: 2023-02-07 | Disposition: A | Discharge status: Home / Self Care | Payer: 59 | Attending: Emergency Medicine | Admitting: Emergency Medicine

## 2023-02-07 ENCOUNTER — Encounter (HOSPITAL_BASED_OUTPATIENT_CLINIC_OR_DEPARTMENT_OTHER): Payer: Self-pay

## 2023-02-07 ENCOUNTER — Emergency Department (HOSPITAL_BASED_OUTPATIENT_CLINIC_OR_DEPARTMENT_OTHER): Payer: 59

## 2023-02-07 DIAGNOSIS — R42 Dizziness and giddiness: Secondary | ICD-10-CM | POA: Insufficient documentation

## 2023-02-07 DIAGNOSIS — I10 Essential (primary) hypertension: Secondary | ICD-10-CM | POA: Insufficient documentation

## 2023-02-07 DIAGNOSIS — Z79899 Other long term (current) drug therapy: Secondary | ICD-10-CM | POA: Insufficient documentation

## 2023-02-07 DIAGNOSIS — R600 Localized edema: Secondary | ICD-10-CM | POA: Insufficient documentation

## 2023-02-07 HISTORY — DX: Essential (primary) hypertension: I10

## 2023-02-07 HISTORY — DX: Pure hypercholesterolemia, unspecified: E78.00

## 2023-02-07 LAB — CBC
HCT: 48.4 % (ref 39.0–52.0)
Hemoglobin: 16.3 g/dL (ref 13.0–17.0)
MCH: 27.8 pg (ref 26.0–34.0)
MCHC: 33.7 g/dL (ref 30.0–36.0)
MCV: 82.5 fL (ref 80.0–100.0)
Platelets: 304 10*3/uL (ref 150–400)
RBC: 5.87 MIL/uL — ABNORMAL HIGH (ref 4.22–5.81)
RDW: 14 % (ref 11.5–15.5)
WBC: 7.7 10*3/uL (ref 4.0–10.5)
nRBC: 0 % (ref 0.0–0.2)

## 2023-02-07 LAB — URINALYSIS, MICROSCOPIC (REFLEX)

## 2023-02-07 LAB — BASIC METABOLIC PANEL
Anion gap: 8 (ref 5–15)
BUN: 13 mg/dL (ref 6–20)
CO2: 25 mmol/L (ref 22–32)
Calcium: 9 mg/dL (ref 8.9–10.3)
Chloride: 107 mmol/L (ref 98–111)
Creatinine, Ser: 0.93 mg/dL (ref 0.61–1.24)
GFR, Estimated: >60 mL/min (ref >60)
Glucose, Bld: 104 mg/dL — ABNORMAL HIGH (ref 70–99)
Potassium: 3.8 mmol/L (ref 3.5–5.1)
Sodium: 140 mmol/L (ref 135–145)

## 2023-02-07 LAB — URINALYSIS, ROUTINE W REFLEX MICROSCOPIC
Bilirubin Urine: NEGATIVE
Glucose, UA: NEGATIVE mg/dL
Ketones, ur: NEGATIVE mg/dL
Leukocytes,Ua: NEGATIVE
Nitrite: NEGATIVE
Protein, ur: NEGATIVE mg/dL
Specific Gravity, Urine: >=1.03 (ref 1.005–1.030)
pH: 5.5 (ref 5.0–8.0)

## 2023-02-07 LAB — TROPONIN I (HIGH SENSITIVITY): Troponin I (High Sensitivity): 4 ng/L (ref <18)

## 2023-02-07 LAB — BRAIN NATRIURETIC PEPTIDE: B Natriuretic Peptide: 10.1 pg/mL (ref 0.0–100.0)

## 2023-02-07 NOTE — Discharge Instructions (Signed)
Your symptoms are consistent with postural lightheadedness in the setting of your amlodipine use.  Recommend you cut back to 5 mg daily from your 10 mg and follow-up with your primary care physician.  Turn for any episodes of syncope/passing out, chest pain, palpitations, shortness of breath in the setting of your lightheadedness.

## 2023-02-07 NOTE — ED Triage Notes (Signed)
Patient having dizziness that for 6 days that gets worse with standing and walking.  He denied any other symptoms.

## 2023-02-07 NOTE — ED Provider Notes (Signed)
[facility] Provider Note   CSN: [id number] Arrival date & time: 02/07/23  0809     History  Chief Complaint  Patient presents with   Dizziness    Dylan Lam is a 47 y.o. male.   Dizziness    47 year old male presenting to the emergency department with episodic postural lightheadedness.  The patient has a history of hypertension and hyperlipidemia.  He recently had his amlodipine increased from 5 mg to 10 mg daily.  He states that every time he goes from a seated to a standing position he gets lightheaded and feels slightly unsteady.  He denies any room spinning dizziness.  He denies any headaches, fever, chills, cough, chest pain or shortness of breath.  He has had no episodes of syncope.  He denies any heart palpitations.  He has been tolerating oral intake and feels well hydrated.  Home Medications Prior to Admission medications   Medication Sig Start Date End Date Taking? Authorizing Provider  amLODipine (NORVASC) 5 MG tablet Take 1 tablet (5 mg total) by mouth daily. 11/16/19   Maxwell Caul, PA-C  mupirocin ointment (BACTROBAN) 2 % Apply 1 application topically 2 (two) times daily. Patient not taking: Reported on 11/15/2019 10/19/17   Trena Platt D, PA  sulfamethoxazole-trimethoprim (BACTRIM DS,SEPTRA DS) 800-160 MG tablet Take 1 tablet by mouth 2 (two) times daily. Patient not taking: Reported on 11/15/2019 10/12/17   Trena Platt D, PA      Allergies    Penicillins    Review of Systems   Review of Systems  Neurological:  Positive for light-headedness. Negative for syncope.  All other systems reviewed and are negative.   Physical Exam Updated Vital Signs BP (!) 138/91   Pulse (!) 55   Temp 97.8 F (36.6 C) (Oral)   Resp 15   Ht 5\' 6"  (1.676 m)   Wt 87.5 kg   SpO2 100%   BMI 31.15 kg/m  Physical Exam Vitals and nursing note reviewed.  Constitutional:      General: He is not in acute  distress.    Appearance: He is well-developed.  HENT:     Head: Normocephalic and atraumatic.  Eyes:     Conjunctiva/sclera: Conjunctivae normal.  Cardiovascular:     Rate and Rhythm: Normal rate and regular rhythm.  Pulmonary:     Effort: Pulmonary effort is normal. No respiratory distress.     Breath sounds: Normal breath sounds.  Abdominal:     Palpations: Abdomen is soft.     Tenderness: There is no abdominal tenderness.  Musculoskeletal:        General: No swelling.     Cervical back: Neck supple.  Skin:    General: Skin is warm and dry.     Capillary Refill: Capillary refill takes less than 2 seconds.  Neurological:     Mental Status: He is alert.     Comments: MENTAL STATUS EXAM:    Orientation: Alert and oriented to person, place and time.  Memory: Cooperative, follows commands well.  Language: Speech is clear and language is normal.   CRANIAL NERVES:    CN 2 (Optic): Visual fields intact to confrontation.  CN 3,4,6 (EOM): Pupils equal and reactive to light. Full extraocular eye movement without nystagmus.  CN 5 (Trigeminal): Facial sensation is normal, no weakness of masticatory muscles.  CN 7 (Facial): No facial weakness or asymmetry.  CN 8 (Auditory): Auditory acuity grossly normal.  CN 9,10 (Glossophar):  The uvula is midline, the palate elevates symmetrically.  CN 11 (spinal access): Normal sternocleidomastoid and trapezius strength.  CN 12 (Hypoglossal): The tongue is midline. No atrophy or fasciculations.Marland Kitchen   MOTOR:  Muscle Strength: 5/5RUE, 5/5LUE, 5/5RLE, 5/5LLE.   COORDINATION:   Intact finger-to-nose, no tremor.   SENSATION:   Intact to light touch all four extremities.  GAIT: Gait normal without ataxia   Psychiatric:        Mood and Affect: Mood normal.     ED Results / Procedures / Treatments   Labs (all labs ordered are listed, but only abnormal results are displayed) Labs Reviewed  BASIC METABOLIC PANEL - Abnormal; Notable for the following  components:      Result Value   Glucose, Bld 104 (*)    All other components within normal limits  CBC - Abnormal; Notable for the following components:   RBC 5.87 (*)    All other components within normal limits  URINALYSIS, ROUTINE W REFLEX MICROSCOPIC - Abnormal; Notable for the following components:   Hgb urine dipstick SMALL (*)    All other components within normal limits  URINALYSIS, MICROSCOPIC (REFLEX) - Abnormal; Notable for the following components:   Bacteria, UA FEW (*)    All other components within normal limits  BRAIN NATRIURETIC PEPTIDE  TROPONIN I (HIGH SENSITIVITY)    EKG EKG Interpretation Date/Time:  Saturday February 07 2023 08:24:40 EDT Ventricular Rate:  53 PR Interval:  166 QRS Duration:  105 QT Interval:  450 QTC Calculation: 423 R Axis:   78  Text Interpretation: Sinus bradycardia Confirmed by Ernie Avena ([id number]) on 02/07/2023 8:35:26 AM  Radiology DG Chest Portable 1 View  Result Date: 02/07/2023 CLINICAL DATA:  Dizziness, near syncope EXAM: PORTABLE CHEST 1 VIEW COMPARISON:  11/15/2019 FINDINGS: Transverse diameter of heart is increased. There are no signs of pulmonary edema or focal pulmonary consolidation. There is no pleural effusion or pneumothorax. IMPRESSION: There are no focal infiltrates or signs of pulmonary edema. Electronically Signed   By: Ernie Avena M.D.   On: 02/07/2023 10:46    Procedures Procedures    Medications Ordered in ED Medications - No data to display  ED Course/ Medical Decision Making/ A&P                             Medical Decision Making Amount and/or Complexity of Data Reviewed Labs: ordered. Radiology: ordered.    47 year old male presenting to the emergency department with episodic postural lightheadedness.  The patient has a history of hypertension and hyperlipidemia.  He recently had his amlodipine increased from 5 mg to 10 mg daily.  He states that every time he goes from a seated to a standing  position he gets lightheaded and feels slightly unsteady.  He denies any room spinning dizziness.  He denies any headaches, fever, chills, cough, chest pain or shortness of breath.  He has had no episodes of syncope.  He denies any heart palpitations.  He has been tolerating oral intake and feels well hydrated.    On arrival, the patient was afebrile, mildly bradycardic heart rate 55, subsequently normal sinus rhythm noted on cardiac telemetry, BP 125/79, saturating 96% on room air.  Physical exam significant for a normal neurologic exam, lungs clear to auscultation bilaterally with no murmurs rubs or gallops.  The patient is PERC negative, low suspicion for PE.  Laboratory evaluation significant for CBC without a leukocytosis or anemia, urinalysis  without evidence of UTI, BMP without significant electrolyte abnormality, initial troponin and BNP negative.  Per the faint score the patient is low risk from a presyncope standpoint.  He presents with what sounds to be orthostatic lightheadedness in the setting of his amlodipine increased.  An EKG was performed revealed sinus bradycardia, no acute ischemic changes, no abnormal intervals, no evidence of HOCM, Brugada syndrome, arrhythmogenic right ventricular dysplasia.  Low suspicion for symptomatic bradycardia as the patient's heart rate at rest was in the mid 50s to normal.  An x-ray was performed which revealed no abnormal cardiac or pulmonary findings.  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  Low suspicion for acute cardiogenic etiology of the patient's presentation.  I recommended the patient cut back on his amlodipine from 10 mg to 5 mg and follow-up with his PCP.  He has a scheduled appointment this upcoming week.  I provided return precautions in the event of any severe worsening symptoms, need to return in the event of need for further syncope  workup.   Final Clinical Impression(s) / ED Diagnoses Final diagnoses:  Postural lightheadedness    Rx / DC Orders ED Discharge Orders     None         Ernie Avena, MD 02/07/23 1131
